# Patient Record
Sex: Male | Born: 1970 | Race: Black or African American | Hispanic: No | Marital: Single | Smoking: Never smoker
Health system: Southern US, Community
[De-identification: ages and names within clinical notes are randomized; demographics above are authoritative.]

## PROBLEM LIST (undated history)

## (undated) DIAGNOSIS — J45909 Unspecified asthma, uncomplicated: Secondary | ICD-10-CM

## (undated) DIAGNOSIS — I10 Essential (primary) hypertension: Secondary | ICD-10-CM

---

## 2014-10-02 ENCOUNTER — Emergency Department (HOSPITAL_COMMUNITY)
Admission: EM | Admit: 2014-10-02 | Discharge: 2014-10-03 | Disposition: A | Discharge status: Home / Self Care | Payer: Self-pay | Attending: Emergency Medicine | Admitting: Emergency Medicine

## 2014-10-02 ENCOUNTER — Encounter (HOSPITAL_COMMUNITY): Payer: Self-pay | Admitting: Emergency Medicine

## 2014-10-02 ENCOUNTER — Emergency Department (HOSPITAL_COMMUNITY): Payer: Self-pay

## 2014-10-02 DIAGNOSIS — K2970 Gastritis, unspecified, without bleeding: Secondary | ICD-10-CM | POA: Insufficient documentation

## 2014-10-02 DIAGNOSIS — I10 Essential (primary) hypertension: Secondary | ICD-10-CM | POA: Insufficient documentation

## 2014-10-02 DIAGNOSIS — R109 Unspecified abdominal pain: Secondary | ICD-10-CM

## 2014-10-02 DIAGNOSIS — J45909 Unspecified asthma, uncomplicated: Secondary | ICD-10-CM | POA: Insufficient documentation

## 2014-10-02 HISTORY — DX: Unspecified asthma, uncomplicated: J45.909

## 2014-10-02 HISTORY — DX: Essential (primary) hypertension: I10

## 2014-10-02 LAB — CBC WITH DIFFERENTIAL/PLATELET
Basophils Absolute: 0 10*3/uL (ref 0.0–0.1)
Basophils Relative: 0 % (ref 0–1)
EOS PCT: 1 % (ref 0–5)
Eosinophils Absolute: 0.1 10*3/uL (ref 0.0–0.7)
HCT: 45.3 % (ref 39.0–52.0)
Hemoglobin: 15 g/dL (ref 13.0–17.0)
LYMPHS ABS: 1.5 10*3/uL (ref 0.7–4.0)
LYMPHS PCT: 16 % (ref 12–46)
MCH: 27.9 pg (ref 26.0–34.0)
MCHC: 33.1 g/dL (ref 30.0–36.0)
MCV: 84.4 fL (ref 78.0–100.0)
Monocytes Absolute: 0.4 10*3/uL (ref 0.1–1.0)
Monocytes Relative: 4 % (ref 3–12)
NEUTROS ABS: 7.4 10*3/uL (ref 1.7–7.7)
Neutrophils Relative %: 79 % — ABNORMAL HIGH (ref 43–77)
PLATELETS: 281 10*3/uL (ref 150–400)
RBC: 5.37 MIL/uL (ref 4.22–5.81)
RDW: 13.3 % (ref 11.5–15.5)
WBC: 9.3 10*3/uL (ref 4.0–10.5)

## 2014-10-02 MED ORDER — GI COCKTAIL ~~LOC~~
30.0000 mL | Freq: Once | ORAL | Status: AC
  Administered 2014-10-02: 30 mL via ORAL
  Filled 2014-10-02: qty 30

## 2014-10-02 NOTE — ED Notes (Signed)
Pt arrived to the ED with a complaint of upper abdominal pain.  Pt states that he was seen a week ago for this burning sensation but the pain has returned today.

## 2014-10-02 NOTE — ED Notes (Signed)
Unsuccessfully attempted to obtain blood for labs x2.RN made aware 

## 2014-10-03 LAB — URINALYSIS, ROUTINE W REFLEX MICROSCOPIC
Bilirubin Urine: NEGATIVE
Glucose, UA: NEGATIVE mg/dL
HGB URINE DIPSTICK: NEGATIVE
KETONES UR: NEGATIVE mg/dL
LEUKOCYTES UA: NEGATIVE
Nitrite: NEGATIVE
PH: 8.5 — AB (ref 5.0–8.0)
Protein, ur: NEGATIVE mg/dL
Specific Gravity, Urine: 1.021 (ref 1.005–1.030)
Urobilinogen, UA: 1 mg/dL (ref 0.0–1.0)

## 2014-10-03 LAB — LIPASE, BLOOD: Lipase: 17 U/L (ref 11–59)

## 2014-10-03 LAB — COMPREHENSIVE METABOLIC PANEL
ALT: 15 U/L (ref 0–53)
AST: 16 U/L (ref 0–37)
Albumin: 4.2 g/dL (ref 3.5–5.2)
Alkaline Phosphatase: 122 U/L — ABNORMAL HIGH (ref 39–117)
Anion gap: 18 — ABNORMAL HIGH (ref 5–15)
BILIRUBIN TOTAL: 0.7 mg/dL (ref 0.3–1.2)
BUN: 8 mg/dL (ref 6–23)
CALCIUM: 10.2 mg/dL (ref 8.4–10.5)
CO2: 25 meq/L (ref 19–32)
Chloride: 97 mEq/L (ref 96–112)
Creatinine, Ser: 0.99 mg/dL (ref 0.50–1.35)
GFR calc Af Amer: >90 mL/min (ref >90)
GFR calc non Af Amer: >90 mL/min (ref >90)
Glucose, Bld: 131 mg/dL — ABNORMAL HIGH (ref 70–99)
Potassium: 4 mEq/L (ref 3.7–5.3)
SODIUM: 140 meq/L (ref 137–147)
Total Protein: 9 g/dL — ABNORMAL HIGH (ref 6.0–8.3)

## 2014-10-03 MED ORDER — SUCRALFATE 1 G PO TABS: 1.0000 g | ORAL_TABLET | Freq: Three times a day (TID) | ORAL | Status: AC

## 2014-10-03 MED ORDER — FAMOTIDINE 20 MG PO TABS: 20.0000 mg | ORAL_TABLET | Freq: Two times a day (BID) | ORAL | Status: AC

## 2014-10-03 NOTE — ED Provider Notes (Signed)
CSN: [id number]     Arrival date & time 10/02/14  2002 History   First MD Initiated Contact with Patient 10/02/14 2245     Chief Complaint  Patient presents with  . Abdominal Pain     (Consider location/radiation/quality/duration/timing/severity/associated sxs/prior Treatment) HPI Gerald Christian is a 43 y.o. male with history of hypertension, asthma, GERD, presents to emergency department complaining of epigastric abdominal pain. Pt states pain started several weeks ago, but worsened in the last few days. Reports pain started as burning, now is sharp. Hx of similar pain. States feels like his GERD. Admits to eating spicy foods. Admits to not taking his nexium daily. States had one episode of vomiting several days ago, none since then. States also feels like he could be constipated. Tool miralax and had a BM today. No fever, chills. No chest pain. No hematemesis. No blood in stool,  No black tarry stools. No other complaints.   Past Medical History  Diagnosis Date  . Hypertension   . Asthma    History reviewed. No pertinent past surgical history. History reviewed. No pertinent family history. History  Substance Use Topics  . Smoking status: Never Smoker   . Smokeless tobacco: Never Used  . Alcohol Use: Yes    Review of Systems  Constitutional: Negative for fever and chills.  Respiratory: Negative for cough, chest tightness and shortness of breath.   Cardiovascular: Negative for chest pain, palpitations and leg swelling.  Gastrointestinal: Positive for nausea, vomiting and abdominal pain. Negative for diarrhea, blood in stool and abdominal distention.  Genitourinary: Negative for dysuria, urgency, frequency and hematuria.  Musculoskeletal: Negative for myalgias, arthralgias, neck pain and neck stiffness.  Skin: Negative for rash.  Allergic/Immunologic: Negative for immunocompromised state.  Neurological: Negative for dizziness, weakness, light-headedness, numbness and headaches.   All other systems reviewed and are negative.     Allergies  Review of patient's allergies indicates no known allergies.  Home Medications   Prior to Admission medications   Not on File   BP 183/94 mmHg  Pulse 78  Temp(Src) 98.1 F (36.7 C) (Oral)  Resp 18  Ht 5\' 11"  (1.803 m)  Wt 333 lb (151.048 kg)  BMI 46.46 kg/m2  SpO2 98% Physical Exam  Constitutional: He appears well-developed and well-nourished. No distress.  HENT:  Head: Normocephalic and atraumatic.  Eyes: Conjunctivae are normal.  Neck: Neck supple.  Cardiovascular: Normal rate, regular rhythm and normal heart sounds.   Pulmonary/Chest: Effort normal. No respiratory distress. He has no wheezes. He has no rales.  Abdominal: Soft. Bowel sounds are normal. He exhibits no distension. There is tenderness. There is no rebound.  Epigastric tenderness  Musculoskeletal: He exhibits no edema.  Neurological: He is alert.  Skin: Skin is warm and dry.  Nursing note and vitals reviewed.   ED Course  Procedures (including critical care time) Labs Review Labs Reviewed  CBC WITH DIFFERENTIAL - Abnormal; Notable for the following:    Neutrophils Relative % 79 (*)    All other components within normal limits  COMPREHENSIVE METABOLIC PANEL - Abnormal; Notable for the following:    Glucose, Bld 131 (*)    Total Protein 9.0 (*)    Alkaline Phosphatase 122 (*)    Anion gap 18 (*)    All other components within normal limits  URINALYSIS, ROUTINE W REFLEX MICROSCOPIC - Abnormal; Notable for the following:    pH 8.5 (*)    All other components within normal limits  LIPASE, BLOOD  Imaging Review Dg Abd 2 Views  10/03/2014   CLINICAL DATA:  Abdominal pain with nausea.  Initial encounter.  EXAM: ABDOMEN - 2 VIEW  COMPARISON:  None.  FINDINGS: The bowel gas pattern is normal. There is no evidence of free air. No radio-opaque calculi or other significant radiographic abnormality is seen.  IMPRESSION: Negative.    Electronically Signed   By: Laveda AbbeJeff  Hu M.D.   On: 10/03/2014 00:27     EKG Interpretation None      MDM   Final diagnoses:  Abdominal pain  Gastritis    Patient with epigastric abdominal pain, one episode of nausea and vomiting. Hx of GERD. States feels the same. States has not had a good diet, has been eating large amount of spicy food. Not taking nexium daily. Will get labs, abd x-ray. Pt is hypertensive, did not take his evening medications. States was seen recently placed on bactrim for UTI?  12:55 AM Pt's labs normal. Urine normal. Xray unremarkable. Pain improved with GI cocktail. Abdomen is benign. I do not think patient needs any further imaging at this time. I suspect his symptoms are due to gastric reflux disease versus peptic ulcer disease. Will add Carafate and Pepcid to his Nexium. Follow-up with primary care doctor. Return precautions discussed.  Filed Vitals:   10/02/14 2033 10/02/14 2335  BP: 190/110 183/94  Pulse: 74 78  Temp: 98.1 F (36.7 C)   TempSrc: Oral   Resp: 16 18  Height: 5\' 11"  (1.803 m)   Weight: 333 lb (151.048 kg)   SpO2: 100% 98%     Lottie Musselatyana A Arnulfo Batson, PA-C 10/03/14 0056  Linwood DibblesJon Knapp, MD 10/04/14 [phone]

## 2014-10-03 NOTE — Discharge Instructions (Signed)
Your lab work and x-ray are normal today. Your blood pressure is elevated. Make sure to take your blood pressure medication when get home and daily as prescribed. Limit salt in your diet. Continue nexium. Start pepcid and carafate. Follow up with your doctor if not improving. Return if symptoms worsening.   Gastroesophageal Reflux Disease, Adult Gastroesophageal reflux disease (GERD) happens when acid from your stomach flows up into the esophagus. When acid comes in contact with the esophagus, the acid causes soreness (inflammation) in the esophagus. Over time, GERD may create small holes (ulcers) in the lining of the esophagus. CAUSES   Increased body weight. This puts pressure on the stomach, making acid rise from the stomach into the esophagus.  Smoking. This increases acid production in the stomach.  Drinking alcohol. This causes decreased pressure in the lower esophageal sphincter (valve or ring of muscle between the esophagus and stomach), allowing acid from the stomach into the esophagus.  Late evening meals and a full stomach. This increases pressure and acid production in the stomach.  A malformed lower esophageal sphincter. Sometimes, no cause is found. SYMPTOMS   Burning pain in the lower part of the mid-chest behind the breastbone and in the mid-stomach area. This may occur twice a week or more often.  Trouble swallowing.  Sore throat.  Dry cough.  Asthma-like symptoms including chest tightness, shortness of breath, or wheezing. DIAGNOSIS  Your caregiver may be able to diagnose GERD based on your symptoms. In some cases, X-rays and other tests may be done to check for complications or to check the condition of your stomach and esophagus. TREATMENT  Your caregiver may recommend over-the-counter or prescription medicines to help decrease acid production. Ask your caregiver before starting or adding any new medicines.  HOME CARE INSTRUCTIONS   Change the factors that you can  control. Ask your caregiver for guidance concerning weight loss, quitting smoking, and alcohol consumption.  Avoid foods and drinks that make your symptoms worse, such as:  Caffeine or alcoholic drinks.  Chocolate.  Peppermint or mint flavorings.  Garlic and onions.  Spicy foods.  Citrus fruits, such as oranges, lemons, or limes.  Tomato-based foods such as sauce, chili, salsa, and pizza.  Fried and fatty foods.  Avoid lying down for the 3 hours prior to your bedtime or prior to taking a nap.  Eat small, frequent meals instead of large meals.  Wear loose-fitting clothing. Do not wear anything tight around your waist that causes pressure on your stomach.  Raise the head of your bed 6 to 8 inches with wood blocks to help you sleep. Extra pillows will not help.  Only take over-the-counter or prescription medicines for pain, discomfort, or fever as directed by your caregiver.  Do not take aspirin, ibuprofen, or other nonsteroidal anti-inflammatory drugs (NSAIDs). SEEK IMMEDIATE MEDICAL CARE IF:   You have pain in your arms, neck, jaw, teeth, or back.  Your pain increases or changes in intensity or duration.  You develop nausea, vomiting, or sweating (diaphoresis).  You develop shortness of breath, or you faint.  Your vomit is green, yellow, black, or looks like coffee grounds or blood.  Your stool is red, bloody, or black. These symptoms could be signs of other problems, such as heart disease, gastric bleeding, or esophageal bleeding. MAKE SURE YOU:   Understand these instructions.  Will watch your condition.  Will get help right away if you are not doing well or get worse. Document Released: 07/29/2005 Document Revised: 01/11/2012 Document  Reviewed: 05/08/2011 [employer] Patient Information 2015 [employer] [place]. This information is not intended to replace advice given to you by your health care provider. Make sure you discuss any questions you have with your health  care provider.

## 2015-12-15 IMAGING — CR DG ABDOMEN 2V
4 series · 4 of 4 positions shown · non-contrast
Comparison: None.

CLINICAL DATA: Abdominal pain with nausea.  Initial encounter.

EXAM:
ABDOMEN - 2 VIEW

[w abdomen upright (1 of 2)]
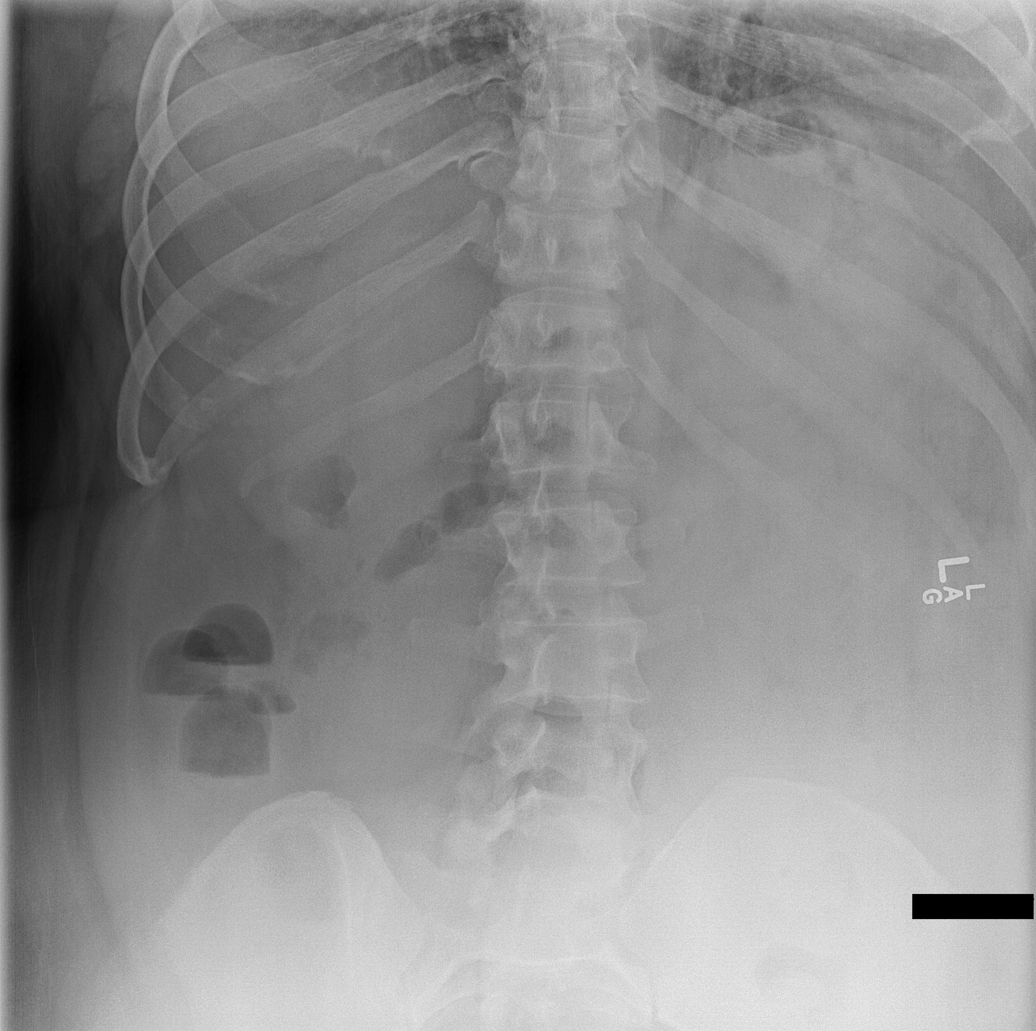

[w abdomen upright (2 of 2)]
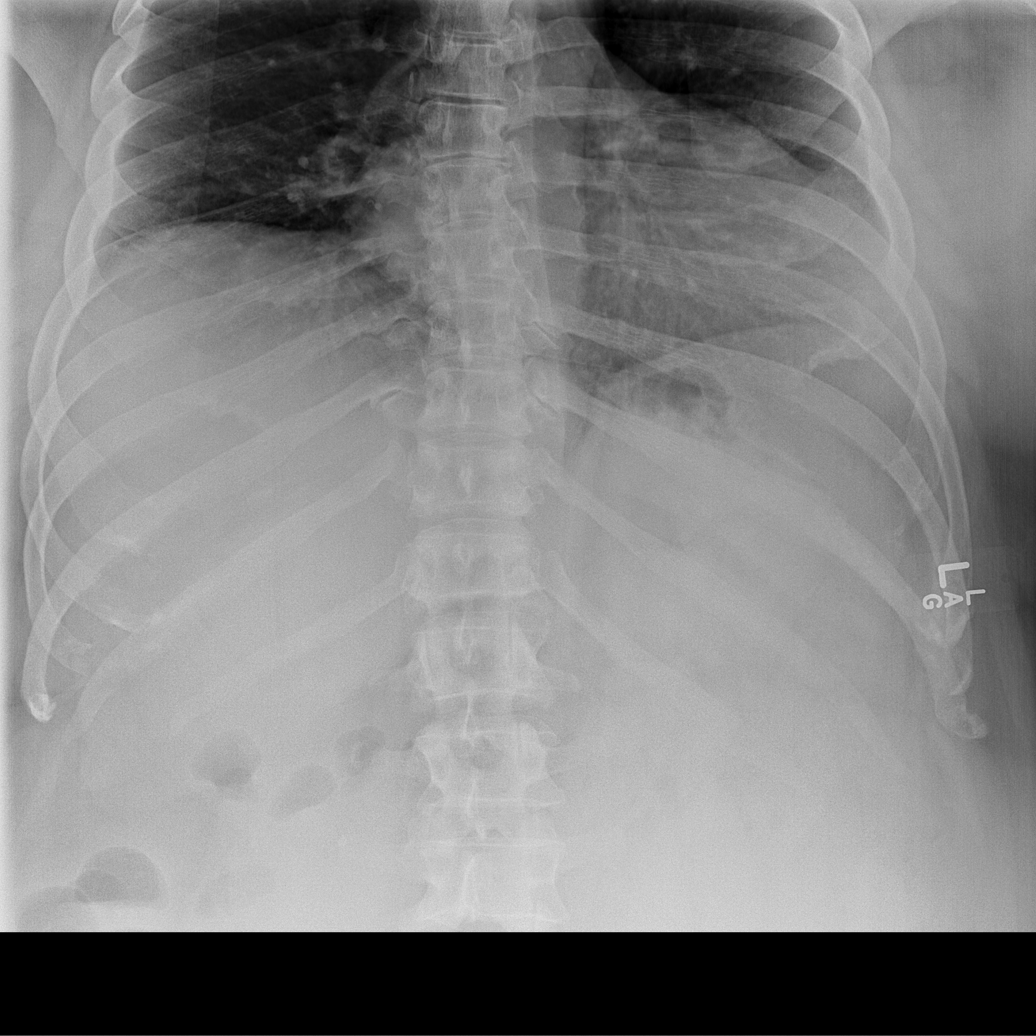

[t abdomen supine (1 of 2)]
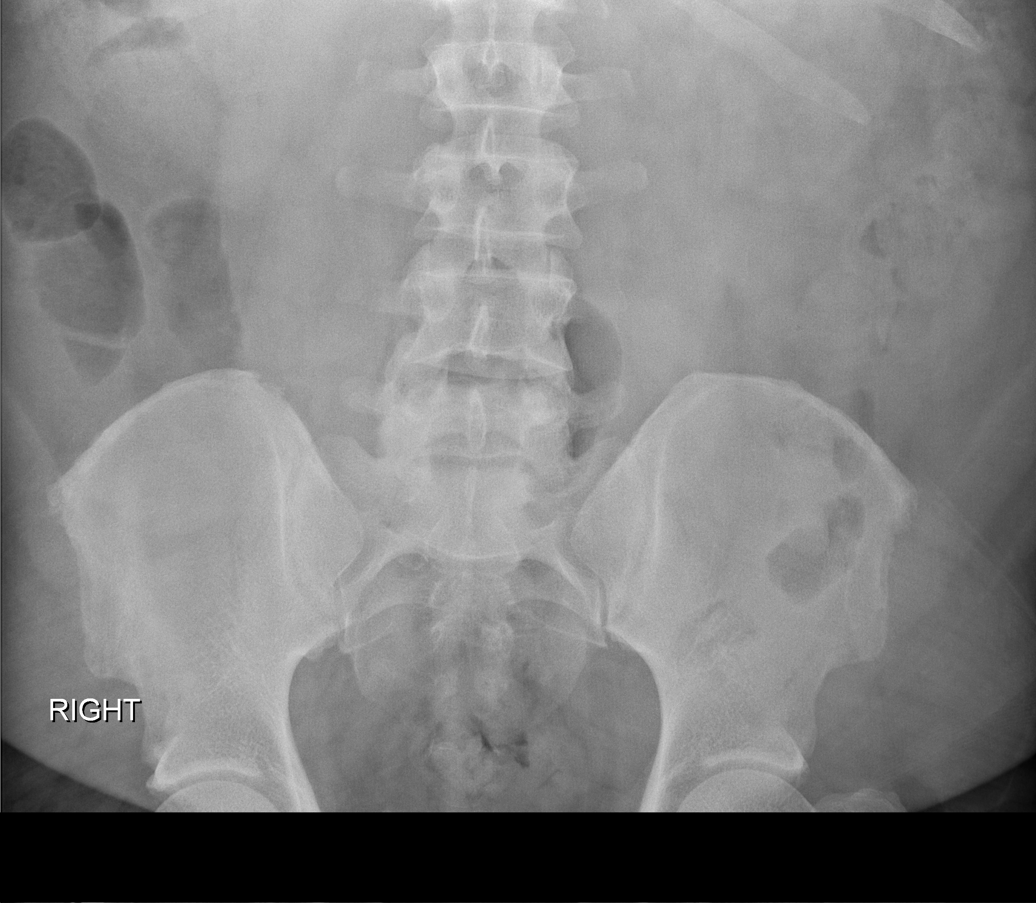

[t abdomen supine (2 of 2)]
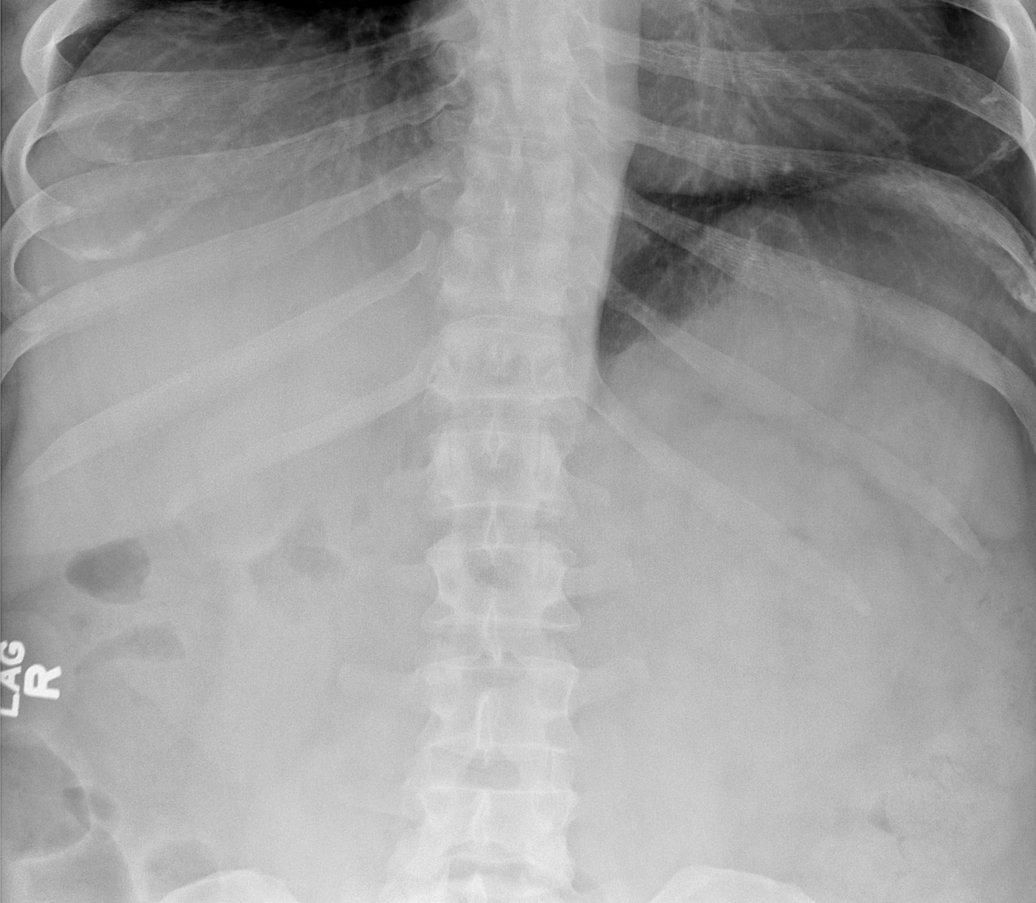

[4 of 4 positions shown; findings below may reference images not displayed]

FINDINGS: The bowel gas pattern is normal. There is no evidence of free air.
No radio-opaque calculi or other significant radiographic
abnormality is seen.
IMPRESSION: Negative.
